# Patient Record
Sex: Male | Born: 1995 | Race: White | Hispanic: No | Marital: Single | Smoking: Never smoker
Health system: Southern US, Community
[De-identification: ages and names within clinical notes are randomized; demographics above are authoritative.]

---
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
## 2018-04-05 IMAGING — DX DG CHEST 2V
2 series · 2 of 2 positions shown · non-contrast
Comparison: None in PACs

CLINICAL DATA: Cough, sinus congestion, wheezing, and exertional
shortness of breath for the past 2 months.

EXAM:
CHEST  2 VIEW

[chest pa]
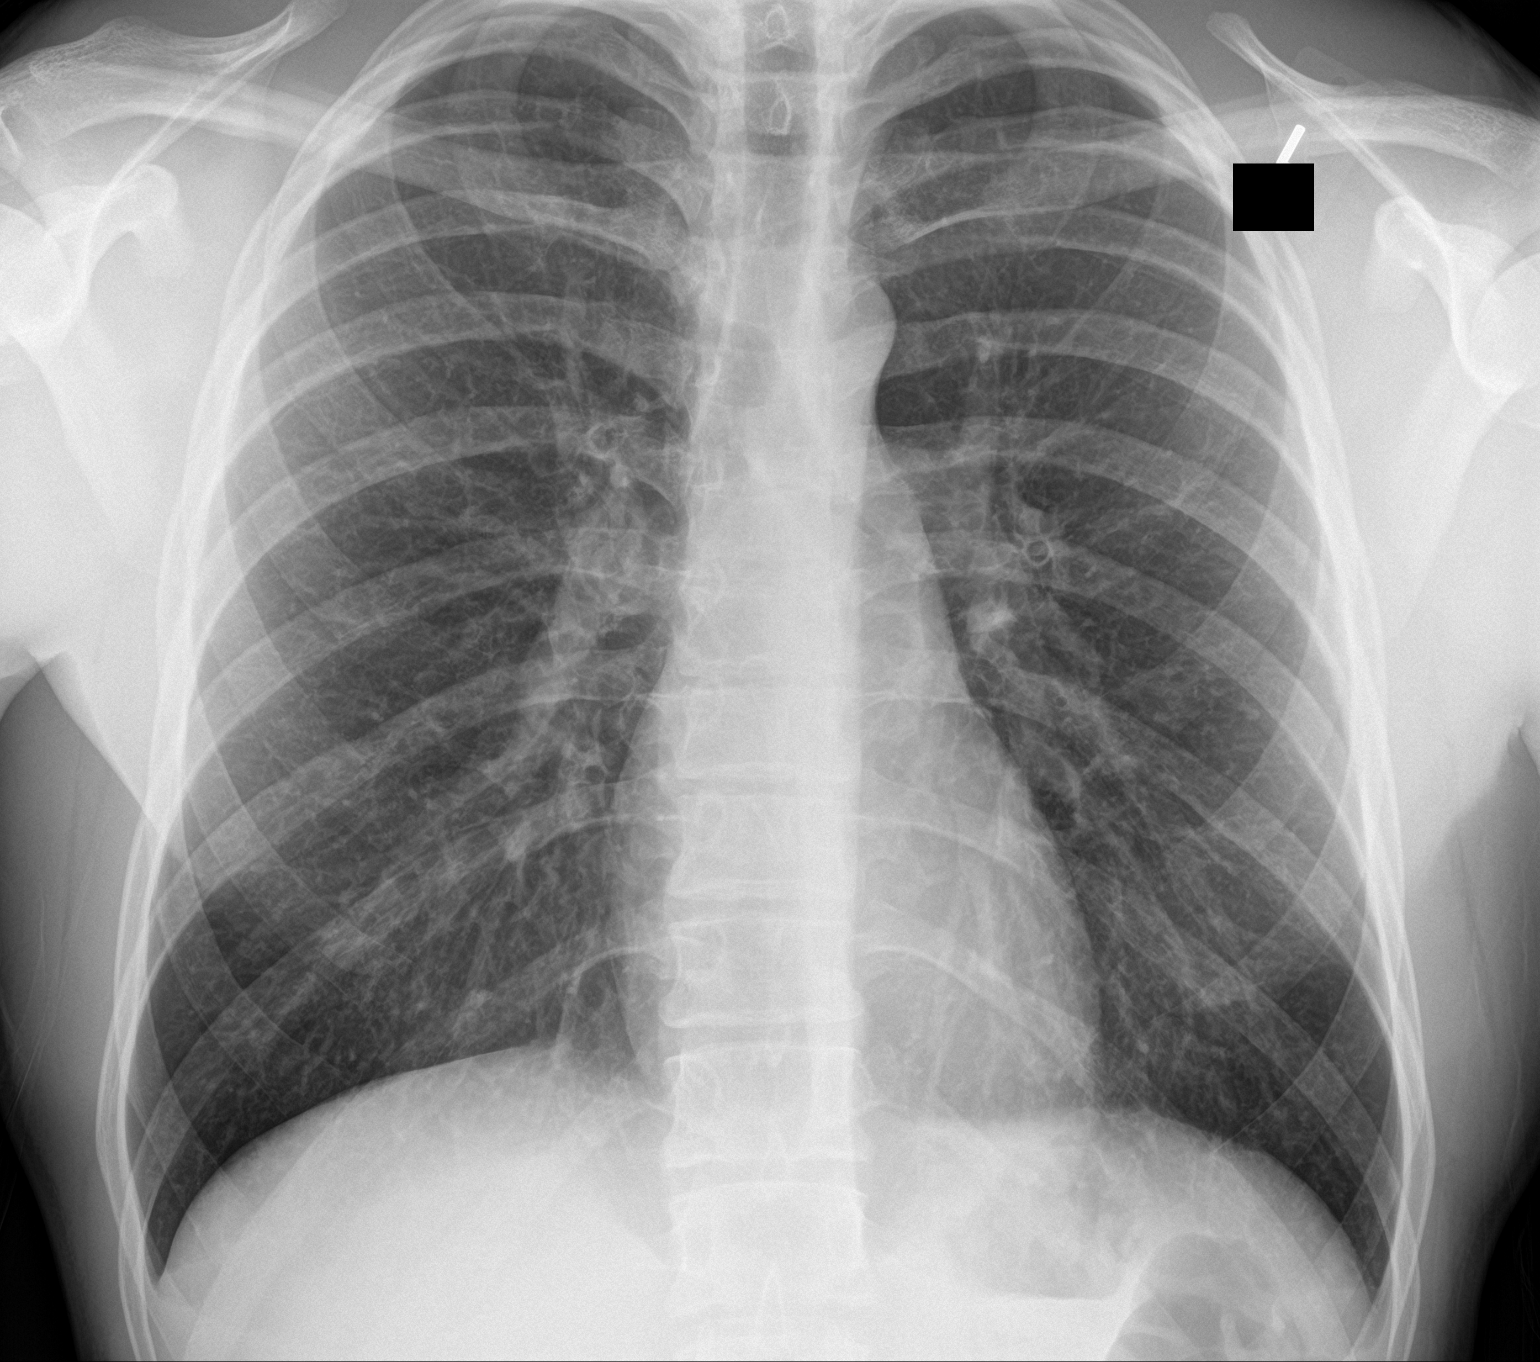

[chest lat]
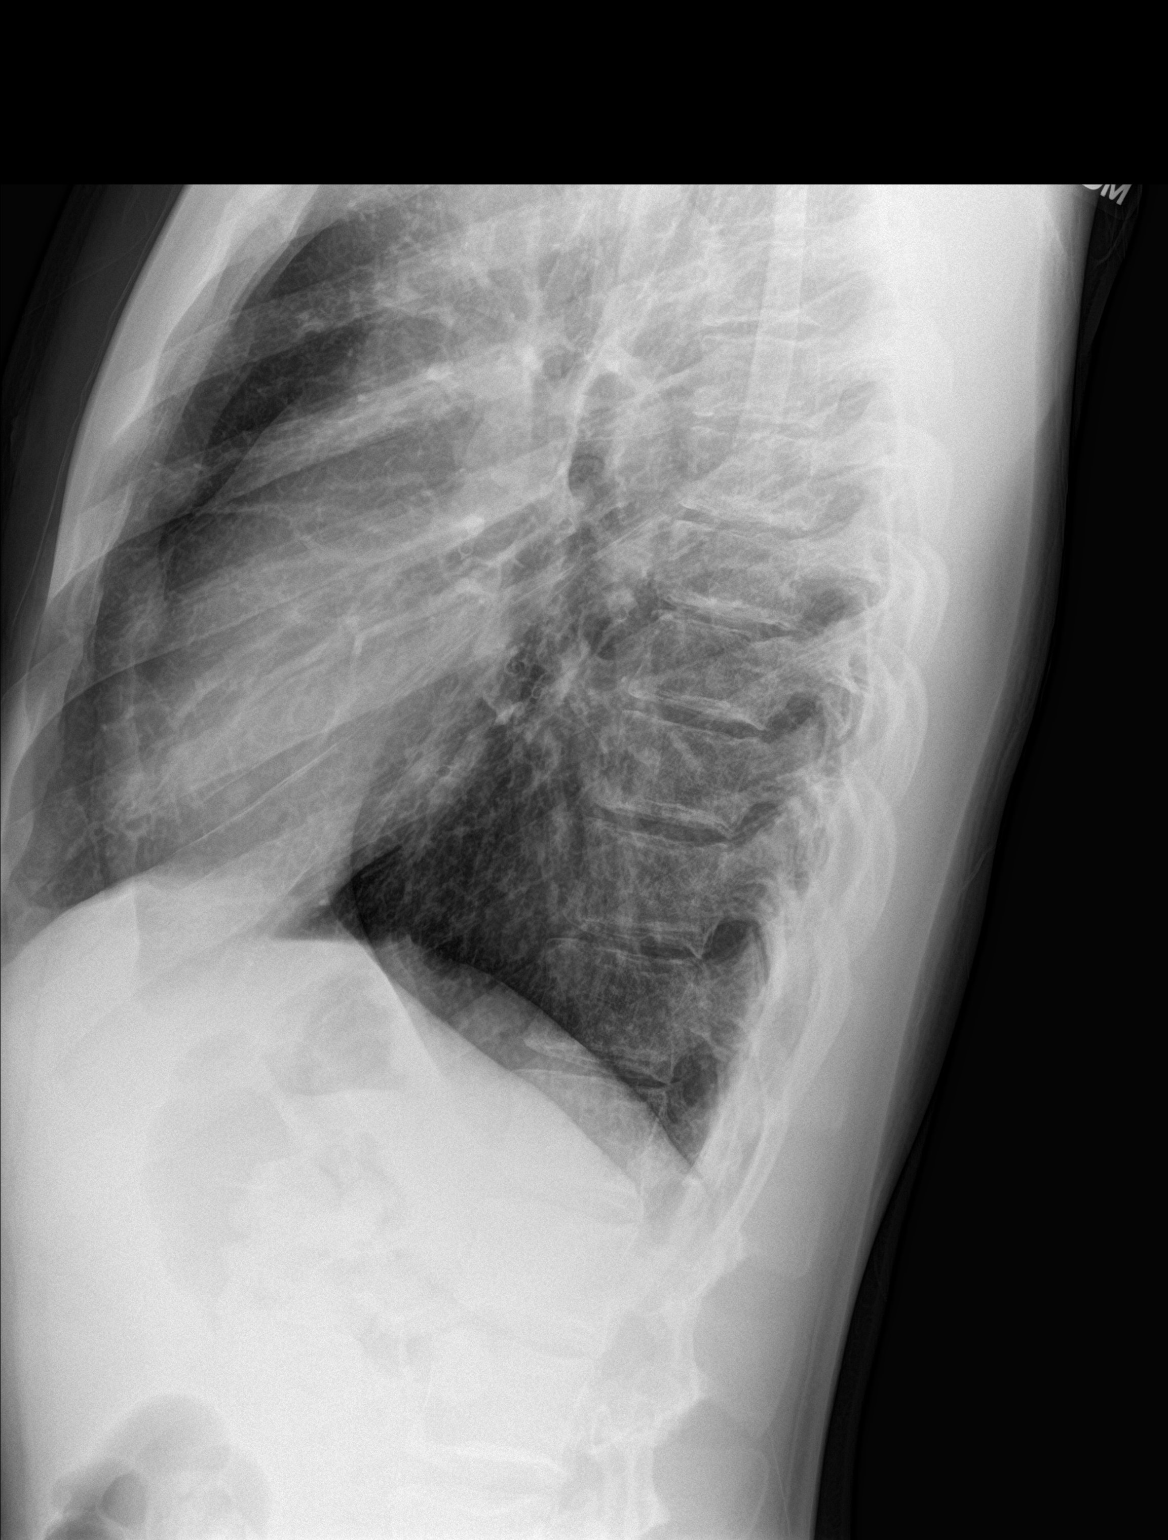

[2 of 2 positions shown; findings below may reference images not displayed]

FINDINGS: The lungs are mildly hyperinflated. The interstitial markings are
coarse. There is no focal infiltrate. The heart and pulmonary
vascularity are normal. The mediastinum is normal in width. There is
no pleural effusion. The bony thorax is unremarkable.
IMPRESSION: Mild hyperinflation with interstitial prominence which may reflect
reactive airway disease or acute bronchitis. There is no alveolar
pneumonia.
